# Patient Record
Sex: Male | Born: 1940 | Race: White | Hispanic: No | Marital: Married | Smoking: Never smoker
Health system: Southern US, Community
[De-identification: ages and names within clinical notes are randomized; demographics above are authoritative.]

## PROBLEM LIST (undated history)

## (undated) DIAGNOSIS — E119 Type 2 diabetes mellitus without complications: Secondary | ICD-10-CM

## (undated) HISTORY — PX: ABDOMINAL SURGERY: SHX537

---

## 2016-10-19 ENCOUNTER — Encounter (HOSPITAL_BASED_OUTPATIENT_CLINIC_OR_DEPARTMENT_OTHER): Payer: Self-pay | Admitting: Emergency Medicine

## 2016-10-19 ENCOUNTER — Emergency Department (HOSPITAL_BASED_OUTPATIENT_CLINIC_OR_DEPARTMENT_OTHER): Payer: 59

## 2016-10-19 ENCOUNTER — Emergency Department (HOSPITAL_BASED_OUTPATIENT_CLINIC_OR_DEPARTMENT_OTHER)
Admission: EM | Admit: 2016-10-19 | Discharge: 2016-10-19 | Disposition: A | Discharge status: Home / Self Care | Payer: 59 | Attending: Emergency Medicine | Admitting: Emergency Medicine

## 2016-10-19 DIAGNOSIS — S20212A Contusion of left front wall of thorax, initial encounter: Secondary | ICD-10-CM | POA: Diagnosis not present

## 2016-10-19 DIAGNOSIS — E119 Type 2 diabetes mellitus without complications: Secondary | ICD-10-CM | POA: Insufficient documentation

## 2016-10-19 DIAGNOSIS — W1839XA Other fall on same level, initial encounter: Secondary | ICD-10-CM | POA: Diagnosis not present

## 2016-10-19 DIAGNOSIS — Y999 Unspecified external cause status: Secondary | ICD-10-CM | POA: Diagnosis not present

## 2016-10-19 DIAGNOSIS — Y939 Activity, unspecified: Secondary | ICD-10-CM | POA: Diagnosis not present

## 2016-10-19 DIAGNOSIS — S299XXA Unspecified injury of thorax, initial encounter: Secondary | ICD-10-CM | POA: Diagnosis present

## 2016-10-19 DIAGNOSIS — W19XXXA Unspecified fall, initial encounter: Secondary | ICD-10-CM

## 2016-10-19 DIAGNOSIS — Y929 Unspecified place or not applicable: Secondary | ICD-10-CM | POA: Diagnosis not present

## 2016-10-19 HISTORY — DX: Type 2 diabetes mellitus without complications: E11.9

## 2016-10-19 NOTE — ED Provider Notes (Signed)
[facility] Provider Note   CSN: [id number] Arrival date & time: 10/19/16  1658  By signing my name below, I, [name], attest that this documentation has been prepared under the direction and in the presence of Alvira MondayErin Hughey Rittenberry, MD.  Electronically Signed: Octavia HeirArianna Nassar, ED Scribe. 10/19/16. 7:47 PM.    History   Chief Complaint Chief Complaint  Patient presents with  . Fall   The history is provided by the patient. No language interpreter was used.   HPI Comments: Henry Ortiz is a 75 y.o. male who has a PMhx of DMII presents to the Emergency Department complaining of sudden onset, gradual worsening, left lateral rib pain s/p a fall that occurred 4 days ago. He describes the pain as sharp. Associated pain when taking a deep breath. Pt says he lost his balance when bending over to put his dog down while walking when he fell forward. He reports increased pain when changing positionssitting, standing, and rolling over in his sleep. Pt says the pain wakes him up out of his sleep.  He did not hit his head or lose consciousness after the fall. Pt has taken ibuprofen and aleve to alleviate his pain with relief. Denies neck pain, numbness, unilateral weakness, abdominal pain, nausea, vomiting, shortness of breath, fever, or cough. He currently takes aspirin.  Past Medical History:  Diagnosis Date  . Diabetes mellitus without complication (HCC)     There are no active problems to display for this patient.   Past Surgical History:  Procedure Laterality Date  . ABDOMINAL SURGERY         Home Medications    Prior to Admission medications   Not on File    Family History History reviewed. No pertinent family history.  Social History Social History  Substance Use Topics  . Smoking status: Never Smoker  . Smokeless tobacco: Not on file  . Alcohol use No     Allergies   Metformin and related   Review of Systems Review of Systems  Constitutional:  Negative for fever.  HENT: Negative for sore throat.   Eyes: Negative for visual disturbance.  Respiratory: Negative for cough and shortness of breath.   Cardiovascular: Positive for chest pain.       Left rib pain  Gastrointestinal: Negative for abdominal pain, nausea and vomiting.  Genitourinary: Negative for difficulty urinating.  Musculoskeletal: Negative for back pain and neck stiffness.  Skin: Negative for rash.  Neurological: Negative for syncope, weakness, numbness and headaches.     Physical Exam Updated Vital Signs BP 135/74 (BP Location: Right Arm)   Pulse 62   Temp 98.1 F (36.7 C)   Resp 13   Ht 6\' 4"  (1.93 m)   Wt 240 lb (108.9 kg)   SpO2 100%   BMI 29.21 kg/m   Physical Exam  Constitutional: He is oriented to person, place, and time. He appears well-developed and well-nourished.  HENT:  Head: Normocephalic and atraumatic.  Eyes: EOM are normal.  Neck: Normal range of motion.  Cardiovascular: Normal rate, regular rhythm, normal heart sounds and intact distal pulses.  Exam reveals no gallop and no friction rub.   No murmur heard. Pulmonary/Chest: Effort normal and breath sounds normal. No respiratory distress. He exhibits tenderness (localized approx 6th rib).  Abdominal: Soft. He exhibits no distension. There is no tenderness.  Musculoskeletal: Normal range of motion.  No cervical, thoracic or lumbar midline tenderness  Neurological: He is alert and oriented to person, place, and time.  Skin: Skin is warm and dry.  Psychiatric: He has a normal mood and affect. Judgment normal.  Nursing note and vitals reviewed.    ED Treatments / Results  DIAGNOSTIC STUDIES: Oxygen Saturation is 100% on RA, normal by my interpretation.  COORDINATION OF CARE:  7:34 PM Discussed treatment plan with pt at bedside and pt agreed to plan.  Labs (all labs ordered are listed, but only abnormal results are displayed) Labs Reviewed - No data to display  EKG  EKG  Interpretation  Date/Time:  Saturday October 19 2016 19:20:01 EDT Ventricular Rate:  64 PR Interval:    QRS Duration: 99 QT Interval:  419 QTC Calculation: 433 R Axis:   51 Text Interpretation:  Sinus rhythm Prolonged PR interval No previous ECGs available Confirmed by ParksideCHLOSSMAN MD, Rosellen Lichtenberger ([id number]) on 10/19/2016 7:35:56 PM       Radiology Dg Ribs Unilateral W/chest Left  Result Date: 10/19/2016 CLINICAL DATA:  75 year old male with a history of fall onto chest with rib pain. EXAM: LEFT RIBS AND CHEST - 3+ VIEW COMPARISON:  None. FINDINGS: Cardiomediastinal silhouette within normal limits. Low lung volumes with coarsened interstitial markings, potentially atelectasis. No confluent airspace disease or pneumothorax. No pleural effusion. Fiducial marker at the left lower chest. No displaced fracture is identified. Atherosclerotic calcifications of the aortic arch. IMPRESSION: No displaced rib fracture identified. No acute cardiopulmonary disease. Aortic atherosclerosis. Signed, Yvone NeuJaime S. Loreta AveWagner, DO [facility] Electronically Signed   By: Gilmer MorJaime  Wagner D.O.   On: 10/19/2016 18:04    Procedures Procedures (including critical care time)  Medications Ordered in ED Medications - No data to display   Initial Impression / Assessment and Plan / ED Course  I have reviewed the triage vital signs and the nursing notes.  Pertinent labs & imaging results that were available during my care of the patient were reviewed by me and considered in my medical decision making (see chart for details).  Clinical Course    75yo male with hx of DM presents with concern for fall 4 days ago and rib pain. Denies other injuries. XR shows no sign of pneumothorax, hemothorax or displaced rib fx.  Discussed possible occult rib fx. Reports pain control adequate with OTC medications. Able to pull 2000cc on spirometry. Doubt other etiology of CP such as ACS/PE, given  history of pain from fall, localized pain and tenderness.  Recommend continued use of spirometer, discussed reasons to return in detail. Patient discharged in stable condition with understanding of reasons to return.   Final Clinical Impressions(s) / ED Diagnoses   Final diagnoses:  Fall, initial encounter  Rib contusion, left, initial encounter, versus possible occult fracture   I personally performed the services described in this documentation, which was scribed in my presence. The recorded information has been reviewed and is accurate.   New Prescriptions There are no discharge medications for this patient.    Alvira MondayErin Merrell Borsuk, MD 10/20/16 1053

## 2016-10-19 NOTE — ED Triage Notes (Signed)
Pt in c/o fall x 4 days ago with L lateral rib pain since then. States has been having dizziness lately which he is being treated for by his PCP. Pt alert, interactive, ambulatory in NAD.

## 2016-10-19 NOTE — ED Notes (Signed)
Pt c/o fall 2 days ago; pt states feeling dizzy, and having shortness of breath; bilateral breath sounds diminished; peripheral pulses are weak in all 4 limbs; no swelling present at this time

## 2016-10-19 NOTE — ED Notes (Signed)
Doctor at bedside.

## 2016-10-19 NOTE — ED Notes (Signed)
Pt given d/c instructions as per chart. Verbalizes understanding. No questions. 

## 2018-04-20 IMAGING — DX DG RIBS W/ CHEST 3+V*L*
3 series · 3 of 3 positions shown · non-contrast
Comparison: None.

CLINICAL DATA: 75-year-old male with a history of fall onto chest
with rib pain.

EXAM:
LEFT RIBS AND CHEST - 3+ VIEW

[chest pa]
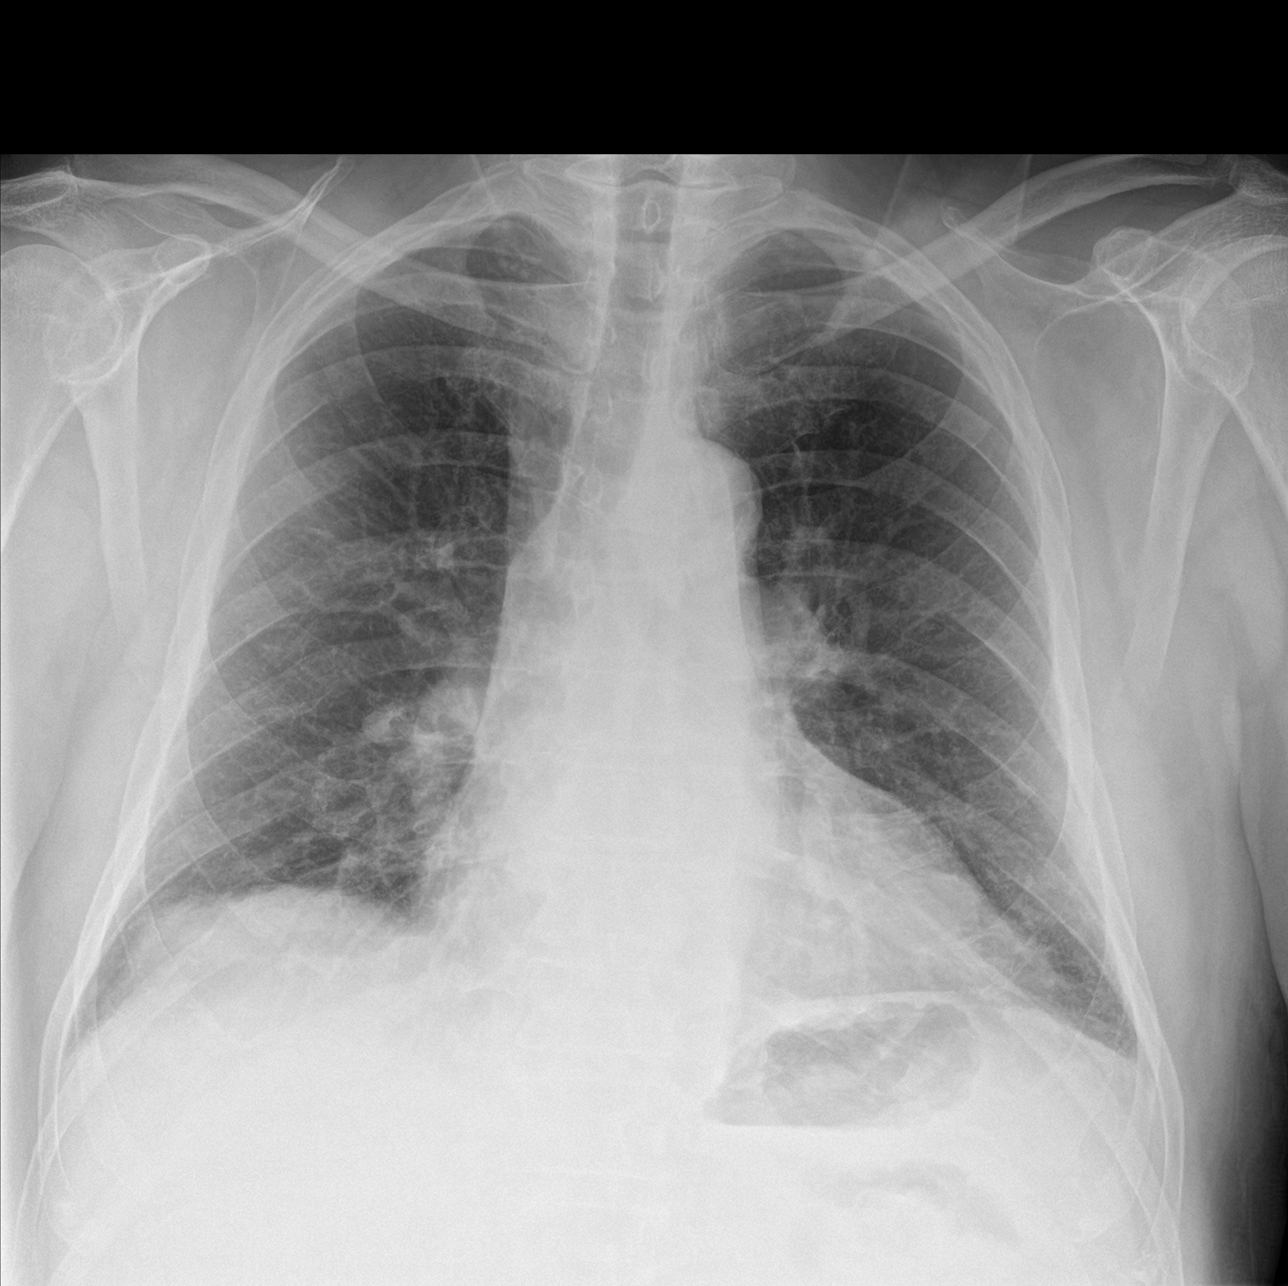

[rib pa obl]
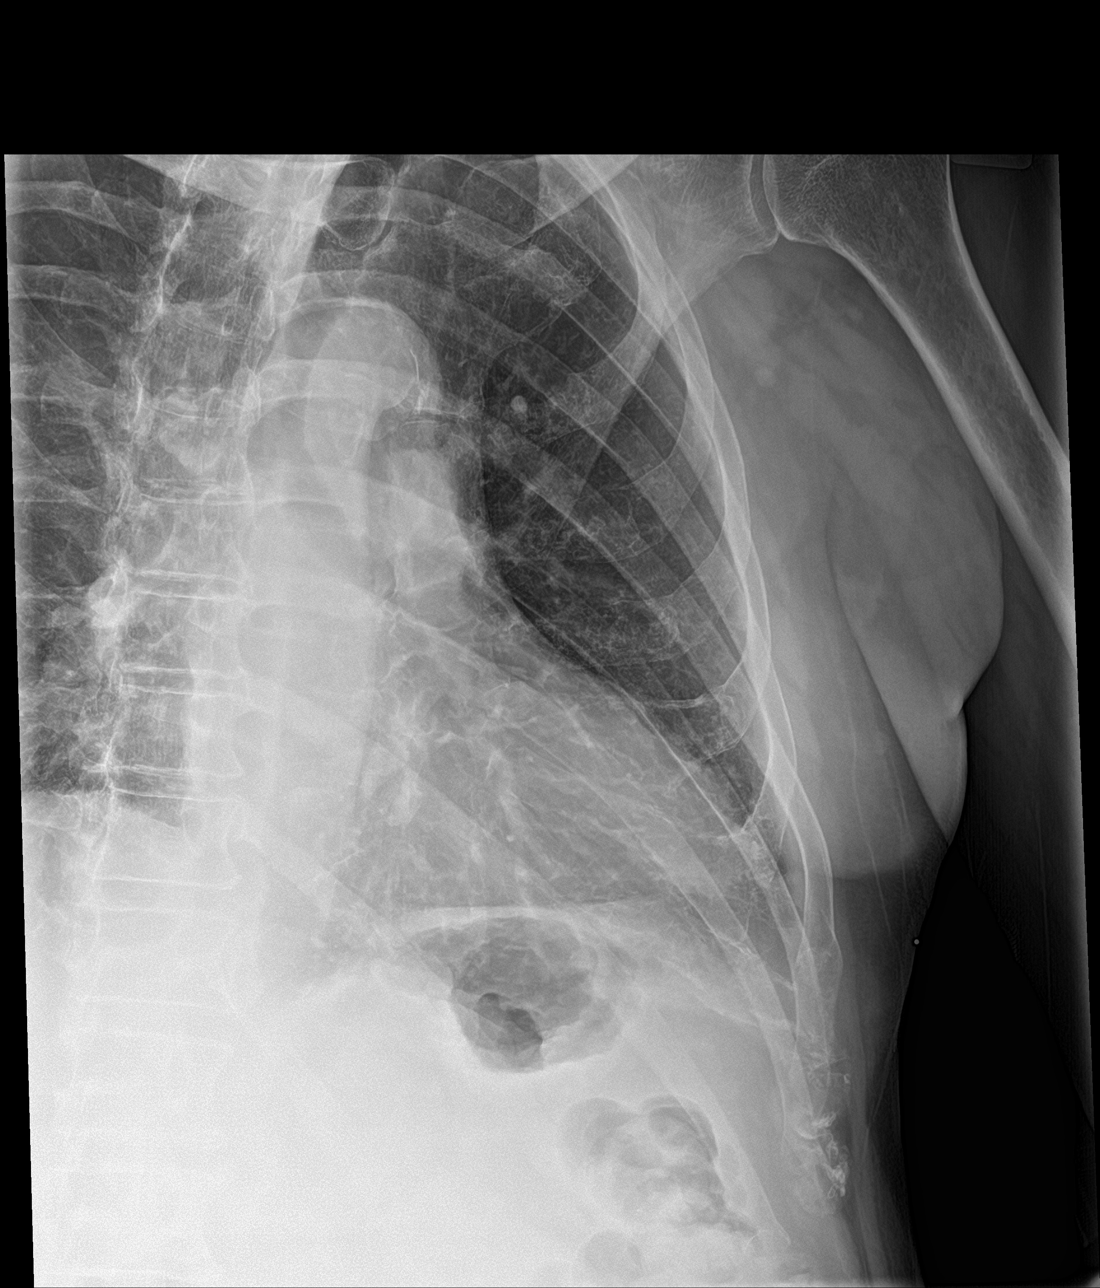

[rib pa]
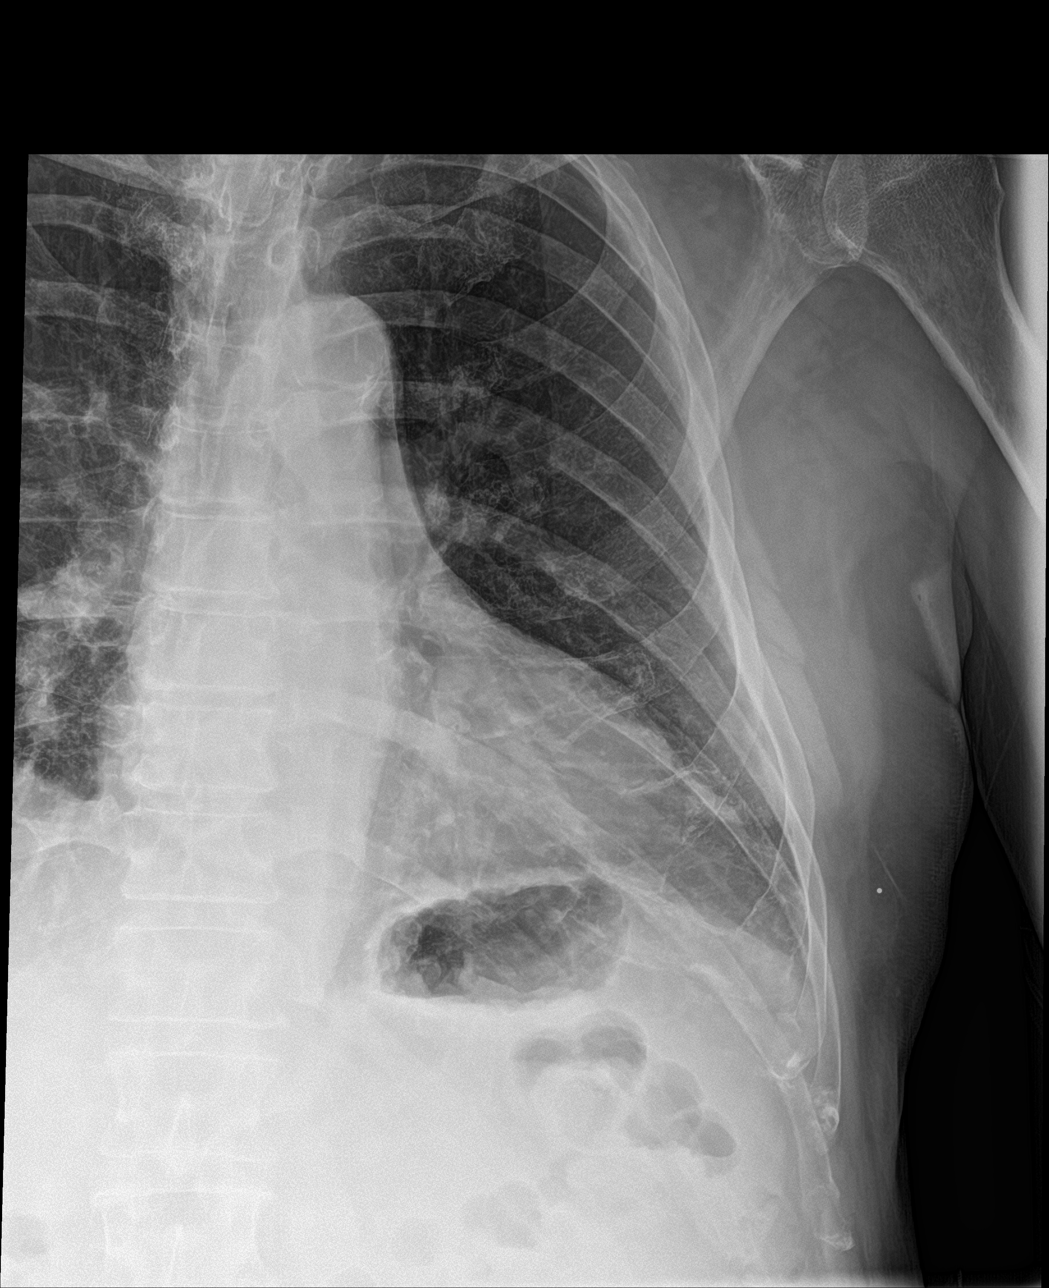

[3 of 3 positions shown; findings below may reference images not displayed]

FINDINGS: Cardiomediastinal silhouette within normal limits.

Low lung volumes with coarsened interstitial markings, potentially
atelectasis. No confluent airspace disease or pneumothorax. No
pleural effusion.

Fiducial marker at the left lower chest. No displaced fracture is
identified.

Atherosclerotic calcifications of the aortic arch.
IMPRESSION: No displaced rib fracture identified.

No acute cardiopulmonary disease.

Aortic atherosclerosis.
